# Patient Record
Sex: Male | Born: 1990 | Race: White | Hispanic: No | Marital: Married | Smoking: Never smoker
Health system: Southern US, Community
[De-identification: ages and names within clinical notes are randomized; demographics above are authoritative.]

---
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
## 2011-06-06 ENCOUNTER — Ambulatory Visit
Admission: RE | Admit: 2011-06-06 | Discharge: 2011-06-06 | Disposition: A | Discharge status: Home / Self Care | Payer: BC Managed Care – PPO | Source: Ambulatory Visit | Attending: Family Medicine | Admitting: Family Medicine

## 2011-06-06 ENCOUNTER — Other Ambulatory Visit: Payer: Self-pay | Admitting: Family Medicine

## 2011-06-13 ENCOUNTER — Ambulatory Visit
Admission: RE | Admit: 2011-06-13 | Discharge: 2011-06-13 | Disposition: A | Discharge status: Home / Self Care | Payer: BC Managed Care – PPO | Source: Ambulatory Visit | Attending: Family Medicine | Admitting: Family Medicine

## 2011-06-13 ENCOUNTER — Other Ambulatory Visit: Payer: Self-pay | Admitting: Family Medicine

## 2011-06-13 DIAGNOSIS — J982 Interstitial emphysema: Secondary | ICD-10-CM
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
## 2012-01-17 IMAGING — CR DG CHEST 2V
2 series · 2 of 2 positions shown · non-contrast
Comparison: [DATE]

CLINICAL DATA: Follow-up pneumomediastinum

CHEST - 2 VIEW

[view not recorded (1 of 2)]
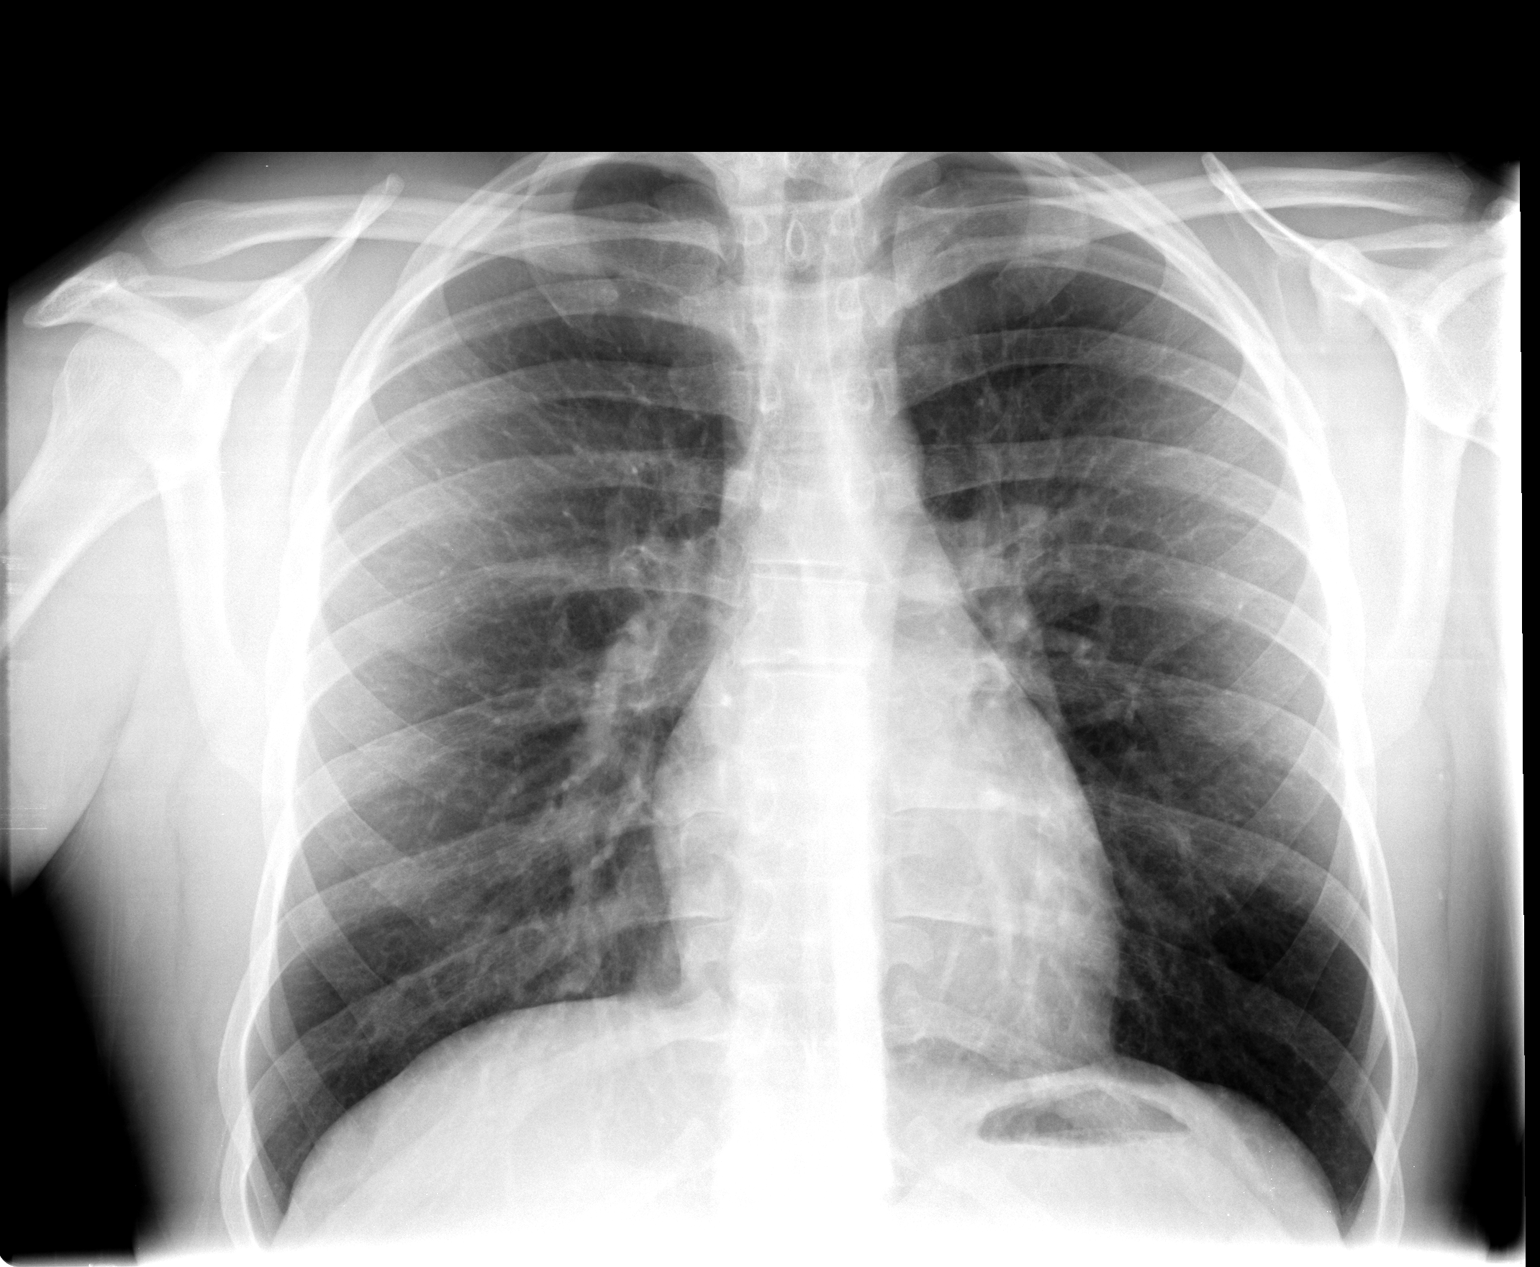

[view not recorded (2 of 2)]
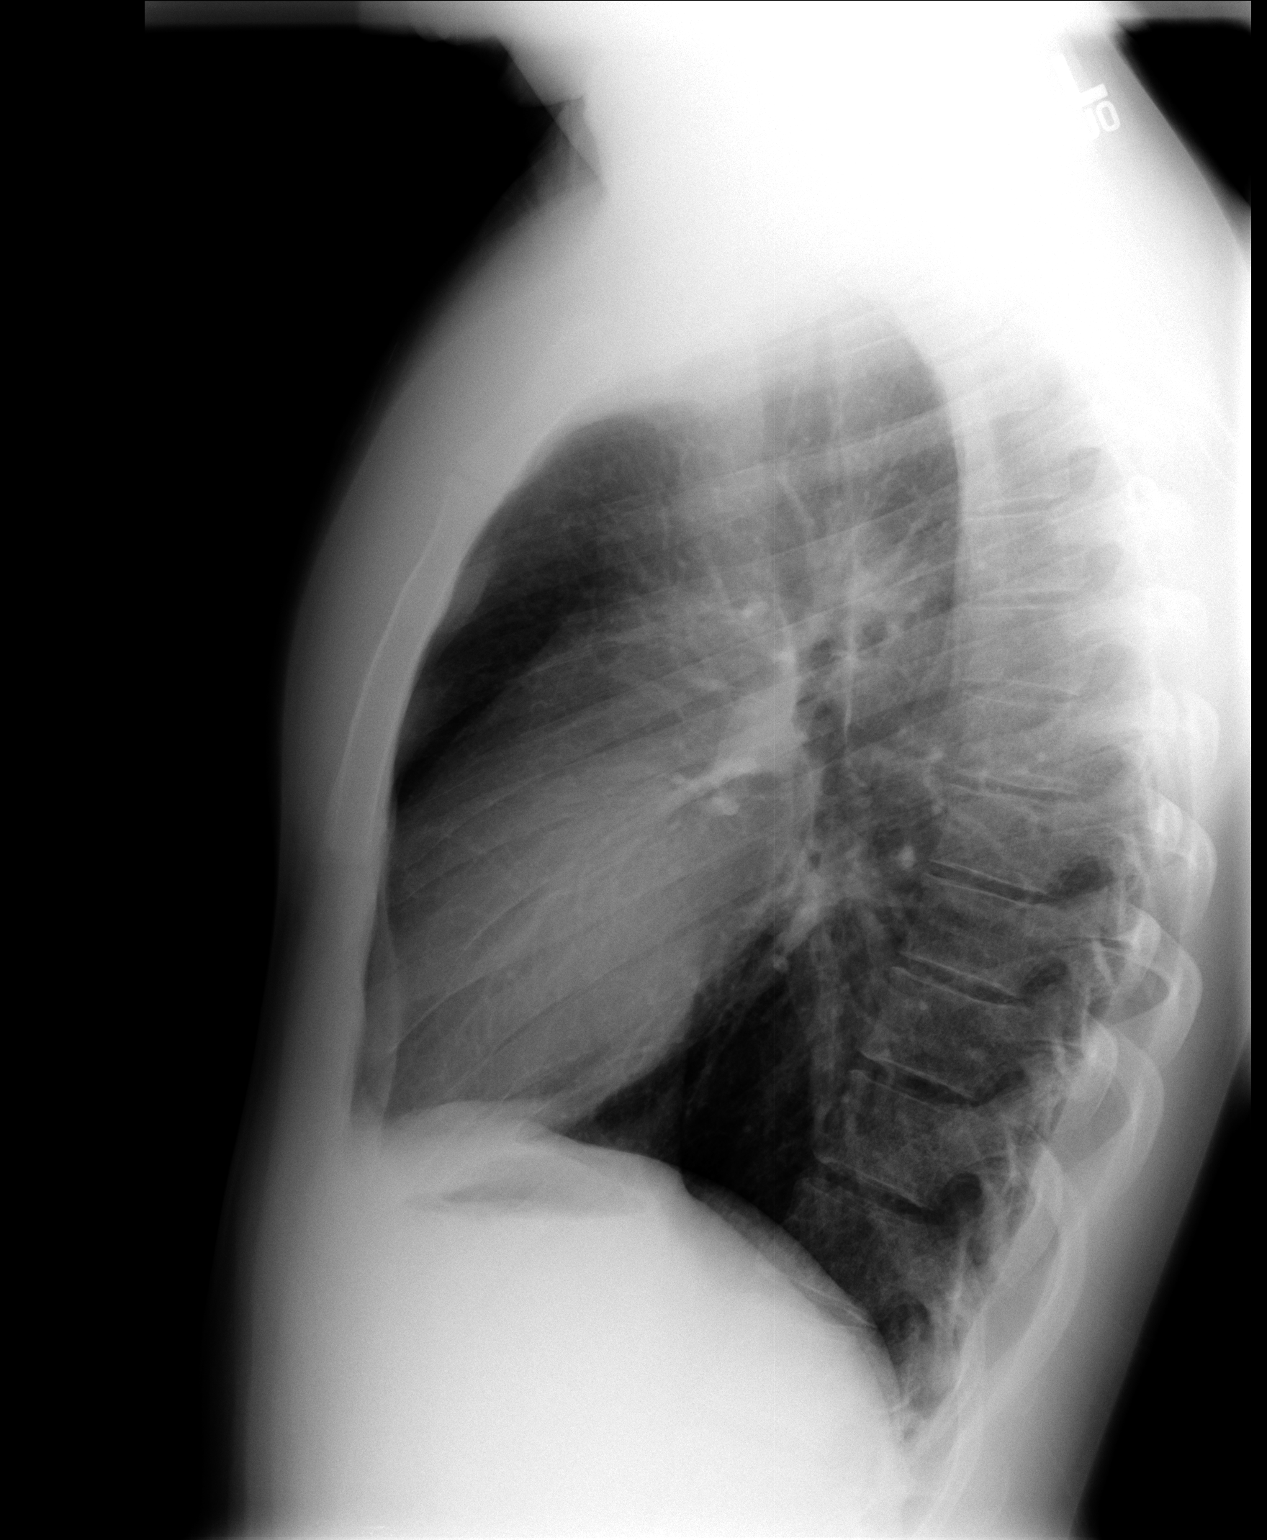

[2 of 2 positions shown; findings below may reference images not displayed]

FINDINGS: The heart size is normal.  Mediastinal shadows are
normal.  No residual pneumomediastinum.  Lungs are clear.  No
effusions.  No bony abnormalities.
IMPRESSION: Normal chest.  Resolution of previously seen pneumomediastinum.

## 2012-05-23 ENCOUNTER — Other Ambulatory Visit: Payer: Self-pay | Admitting: Dermatology

## 2014-02-28 ENCOUNTER — Other Ambulatory Visit: Payer: Self-pay | Admitting: Physician Assistant

## 2018-07-27 ENCOUNTER — Encounter (INDEPENDENT_AMBULATORY_CARE_PROVIDER_SITE_OTHER): Payer: Managed Care, Other (non HMO) | Admitting: Neurology

## 2018-07-27 ENCOUNTER — Ambulatory Visit (INDEPENDENT_AMBULATORY_CARE_PROVIDER_SITE_OTHER): Payer: Managed Care, Other (non HMO) | Admitting: Neurology

## 2018-07-27 DIAGNOSIS — R251 Tremor, unspecified: Secondary | ICD-10-CM

## 2018-07-27 DIAGNOSIS — Z0289 Encounter for other administrative examinations: Secondary | ICD-10-CM

## 2018-07-27 DIAGNOSIS — R202 Paresthesia of skin: Secondary | ICD-10-CM

## 2018-07-27 NOTE — Procedures (Signed)
        Full Name: Maleko Greulich Gender: Male MRN #: [id number] Date of Birth: 15-Jun-1991    Visit Date: 07/27/18 11:14 Age: 27 Years 7 Months Old Examining Physician: Levert Feinstein, MD  Referring Physician: Johny Blamer, MD History: 27 year old male, with intermittent bilateral hands shaking  Summary of the tests:  Nerve conduction study: Right median, ulnar sensory and motor responses were normal.  Right radial sensory response was normal.  Electromyography: Selective needle examination of right upper extremity muscles were normal.  Conclusion: This is a normal study.  There is no electrodiagnostic evidence of right upper extremity neuropathy.    ------------------------------- Levert Feinstein, M.D. PhD  [facility] [address] [place], [place] [address] Tel: [phone] Fax: [phone]        [facility]    Nerve / Sites Muscle Latency Ref. Amplitude Ref. Rel Amp Segments Distance Velocity Ref. Area    ms ms mV mV %  cm m/s m/s mVms  R Median - APB     Wrist APB 3.8 ?4.4 7.5 ?4.0 100 Wrist - APB 7   22.7     Upper arm APB 7.6  7.3  97.2 Upper arm - Wrist 22 57 ?49 21.7  R Ulnar - ADM     Wrist ADM 2.8 ?3.3 12.4 ?6.0 100 Wrist - ADM 7   40.7     B.Elbow ADM 6.3  12.2  98.4 B.Elbow - Wrist 20 56 ?49 40.0     A.Elbow ADM 8.3  11.4  93.1 A.Elbow - B.Elbow 10 51 ?49 39.0         A.Elbow - Wrist             SNC    Nerve / Sites Rec. Site Peak Lat Ref.  Amp Ref. Segments Distance    ms ms V V  cm  R Radial - Anatomical snuff box (Forearm)     Forearm Wrist 2.8 ?2.9 19 ?15 Forearm - Wrist 10  R Median - Orthodromic (Dig II, Mid palm)     Dig II Wrist 3.3 ?3.4 22 ?10 Dig II - Wrist 13  R Ulnar - Orthodromic, (Dig V, Mid palm)     Dig V Wrist 2.8 ?3.1 12 ?5 Dig V - Wrist 55           F  Wave    Nerve F Lat Ref.   ms ms  R Ulnar - ADM 27.6 ?32.0       EMG full       EMG Summary Table    Spontaneous MUAP Recruitment  Muscle IA Fib PSW Fasc Other Amp  Dur. Poly Pattern  R. First dorsal interosseous Normal None None None _______ Normal Normal Normal Normal  R. Pronator teres Normal None None None _______ Normal Normal Normal Normal  R. Brachioradialis Normal None None None _______ Normal Normal Normal Normal  R. Flexor carpi ulnaris Normal None None None _______ Normal Normal Normal Normal  R. Deltoid Normal None None None _______ Normal Normal Normal Normal  R. Biceps brachii Normal None None None _______ Normal Normal Normal Normal

## 2020-02-11 DIAGNOSIS — Z209 Contact with and (suspected) exposure to unspecified communicable disease: Secondary | ICD-10-CM | POA: Diagnosis not present

## 2020-12-30 ENCOUNTER — Telehealth: Payer: Self-pay | Admitting: *Deleted

## 2020-12-30 NOTE — Telephone Encounter (Signed)
received refill request for Rx Finasteride 1mg - denied Patient needs office visit.

## 2021-01-06 ENCOUNTER — Telehealth: Payer: Self-pay | Admitting: *Deleted

## 2021-01-06 NOTE — Telephone Encounter (Signed)
Called [facility] @ [phone] to discontinue Rx Finasteride 1 mg - patient needs office visit.

## 2021-01-13 ENCOUNTER — Other Ambulatory Visit: Payer: Self-pay

## 2021-01-13 ENCOUNTER — Encounter: Payer: Self-pay | Admitting: Physician Assistant

## 2021-01-13 ENCOUNTER — Ambulatory Visit (INDEPENDENT_AMBULATORY_CARE_PROVIDER_SITE_OTHER): Payer: BC Managed Care – PPO | Admitting: Physician Assistant

## 2021-01-13 DIAGNOSIS — L659 Nonscarring hair loss, unspecified: Secondary | ICD-10-CM | POA: Diagnosis not present

## 2021-01-13 MED ORDER — FINASTERIDE 1 MG PO TABS: 1.0000 mg | ORAL_TABLET | Freq: Every day | ORAL | 3 refills | Status: DC

## 2021-01-13 NOTE — Progress Notes (Signed)
   Follow-Up Visit   Subjective  Curtis Nichols is a 30 y.o. male who presents for the following: Follow-up (Hairloss- taking finasteride - seems to help alot).   The following portions of the chart were reviewed this encounter and updated as appropriate:  Tobacco  Allergies  Meds  Problems  Med Hx  Surg Hx  Fam Hx      Objective  Well appearing patient in no apparent distress; mood and affect are within normal limits.  A focused examination was performed including scalp. Relevant physical exam findings are noted in the Assessment and Plan.  Objective  Scalp: Receding hairline with villous growth. Crown stable growth   Assessment & Plan  Alopecia Scalp  finasteride (PROPECIA) 1 MG tablet - Scalp    I, Kongmeng Santoro, PA-C, have reviewed all documentation's for this visit.  The documentation on 01/13/21 for the exam, diagnosis, procedures and orders are all accurate and complete.

## 2021-10-04 ENCOUNTER — Telehealth: Payer: Self-pay | Admitting: *Deleted

## 2021-10-04 DIAGNOSIS — L659 Nonscarring hair loss, unspecified: Secondary | ICD-10-CM

## 2021-10-04 MED ORDER — FINASTERIDE 1 MG PO TABS: 1.0000 mg | ORAL_TABLET | Freq: Every day | ORAL | 3 refills | Status: AC

## 2021-10-04 NOTE — Telephone Encounter (Signed)
Received refill request for Finasteride 1mg  #90 - sent refill to pharmacy.

## 2022-07-13 DIAGNOSIS — Z23 Encounter for immunization: Secondary | ICD-10-CM | POA: Diagnosis not present

## 2022-07-13 DIAGNOSIS — R42 Dizziness and giddiness: Secondary | ICD-10-CM | POA: Diagnosis not present

## 2022-07-13 DIAGNOSIS — L659 Nonscarring hair loss, unspecified: Secondary | ICD-10-CM | POA: Diagnosis not present

## 2022-07-13 DIAGNOSIS — E162 Hypoglycemia, unspecified: Secondary | ICD-10-CM | POA: Diagnosis not present

## 2023-08-25 DIAGNOSIS — Z13220 Encounter for screening for lipoid disorders: Secondary | ICD-10-CM | POA: Diagnosis not present

## 2023-08-25 DIAGNOSIS — L649 Androgenic alopecia, unspecified: Secondary | ICD-10-CM | POA: Diagnosis not present

## 2023-08-25 DIAGNOSIS — Z79899 Other long term (current) drug therapy: Secondary | ICD-10-CM | POA: Diagnosis not present

## 2024-09-05 DIAGNOSIS — L649 Androgenic alopecia, unspecified: Secondary | ICD-10-CM | POA: Diagnosis not present

## 2024-09-05 DIAGNOSIS — Z0000 Encounter for general adult medical examination without abnormal findings: Secondary | ICD-10-CM | POA: Diagnosis not present
